# Patient Record
Sex: Male | Born: 1970 | Race: White | Hispanic: No | Marital: Single | Smoking: Current every day smoker
Health system: Southern US, Community
[De-identification: ages and names within clinical notes are randomized; demographics above are authoritative.]

---

## 2007-09-14 ENCOUNTER — Emergency Department (HOSPITAL_COMMUNITY): Admission: EM | Admit: 2007-09-14 | Discharge: 2007-09-14 | Payer: Self-pay | Admitting: Emergency Medicine

## 2010-10-25 ENCOUNTER — Emergency Department (HOSPITAL_COMMUNITY)
Admission: EM | Admit: 2010-10-25 | Discharge: 2010-10-25 | Payer: Self-pay | Source: Home / Self Care | Admitting: Emergency Medicine

## 2011-01-15 LAB — URINE MICROSCOPIC-ADD ON

## 2011-01-15 LAB — URINALYSIS, ROUTINE W REFLEX MICROSCOPIC
Bilirubin Urine: NEGATIVE
Glucose, UA: NEGATIVE mg/dL
Ketones, ur: NEGATIVE mg/dL
Leukocytes, UA: NEGATIVE
Nitrite: NEGATIVE
Protein, ur: 30 mg/dL — AB
Specific Gravity, Urine: 1.03 (ref 1.005–1.030)
Urobilinogen, UA: 0.2 mg/dL (ref 0.0–1.0)
pH: 5.5 (ref 5.0–8.0)

## 2011-01-15 LAB — BASIC METABOLIC PANEL
BUN: 22 mg/dL (ref 6–23)
CO2: 24 mEq/L (ref 19–32)
Calcium: 9.1 mg/dL (ref 8.4–10.5)
Creatinine, Ser: 1.37 mg/dL (ref 0.4–1.5)
GFR calc non Af Amer: 58 mL/min — ABNORMAL LOW (ref >60)
Glucose, Bld: 112 mg/dL — ABNORMAL HIGH (ref 70–99)
Sodium: 136 mEq/L (ref 135–145)

## 2011-01-15 LAB — CK: Total CK: 8426 U/L — ABNORMAL HIGH (ref 7–232)

## 2011-01-15 LAB — DIFFERENTIAL
Basophils Absolute: 0 K/uL (ref 0.0–0.1)
Basophils Relative: 0 % (ref 0–1)
Eosinophils Absolute: 0 10*3/uL (ref 0.0–0.7)
Eosinophils Relative: 0 % (ref 0–5)
Lymphocytes Relative: 20 % (ref 12–46)
Lymphs Abs: 2.4 K/uL (ref 0.7–4.0)
Monocytes Absolute: 1.5 K/uL — ABNORMAL HIGH (ref 0.1–1.0)
Monocytes Relative: 12 % (ref 3–12)
Neutro Abs: 8.1 K/uL — ABNORMAL HIGH (ref 1.7–7.7)
Neutrophils Relative %: 67 % (ref 43–77)

## 2011-01-15 LAB — CBC
HCT: 45.4 % (ref 39.0–52.0)
Hemoglobin: 15.7 g/dL (ref 13.0–17.0)
MCH: 32.4 pg (ref 26.0–34.0)
MCHC: 34.6 g/dL (ref 30.0–36.0)
MCV: 93.6 fL (ref 78.0–100.0)
Platelets: 196 10*3/uL (ref 150–400)
RBC: 4.85 MIL/uL (ref 4.22–5.81)
RDW: 12.9 % (ref 11.5–15.5)
WBC: 12 K/uL — ABNORMAL HIGH (ref 4.0–10.5)

## 2011-01-15 LAB — BASIC METABOLIC PANEL WITH GFR
Chloride: 102 meq/L (ref 96–112)
GFR calc Af Amer: >60 mL/min (ref >60)
Potassium: 3.6 meq/L (ref 3.5–5.1)

## 2015-10-03 ENCOUNTER — Emergency Department: Payer: Self-pay

## 2015-10-03 ENCOUNTER — Encounter: Payer: Self-pay | Admitting: Emergency Medicine

## 2015-10-03 ENCOUNTER — Emergency Department
Admission: EM | Admit: 2015-10-03 | Discharge: 2015-10-03 | Disposition: A | Discharge status: Home / Self Care | Payer: Self-pay | Attending: Emergency Medicine | Admitting: Emergency Medicine

## 2015-10-03 DIAGNOSIS — R109 Unspecified abdominal pain: Secondary | ICD-10-CM

## 2015-10-03 DIAGNOSIS — N201 Calculus of ureter: Secondary | ICD-10-CM | POA: Insufficient documentation

## 2015-10-03 DIAGNOSIS — F17200 Nicotine dependence, unspecified, uncomplicated: Secondary | ICD-10-CM | POA: Insufficient documentation

## 2015-10-03 DIAGNOSIS — R102 Pelvic and perineal pain: Secondary | ICD-10-CM

## 2015-10-03 DIAGNOSIS — N1330 Unspecified hydronephrosis: Secondary | ICD-10-CM | POA: Insufficient documentation

## 2015-10-03 DIAGNOSIS — R339 Retention of urine, unspecified: Secondary | ICD-10-CM | POA: Insufficient documentation

## 2015-10-03 DIAGNOSIS — N132 Hydronephrosis with renal and ureteral calculous obstruction: Secondary | ICD-10-CM

## 2015-10-03 LAB — CBC WITH DIFFERENTIAL/PLATELET
BASOS PCT: 1 %
Basophils Absolute: 0.1 10*3/uL (ref 0–0.1)
EOS ABS: 0.2 10*3/uL (ref 0–0.7)
Eosinophils Relative: 1 %
HCT: 46.7 % (ref 40.0–52.0)
HEMOGLOBIN: 15.6 g/dL (ref 13.0–18.0)
LYMPHS ABS: 3.8 10*3/uL — AB (ref 1.0–3.6)
Lymphocytes Relative: 31 %
MCH: 31.8 pg (ref 26.0–34.0)
MCHC: 33.3 g/dL (ref 32.0–36.0)
MCV: 95.3 fL (ref 80.0–100.0)
Monocytes Absolute: 1.5 10*3/uL — ABNORMAL HIGH (ref 0.2–1.0)
Monocytes Relative: 12 %
Neutro Abs: 6.7 10*3/uL — ABNORMAL HIGH (ref 1.4–6.5)
Neutrophils Relative %: 55 %
Platelets: 255 10*3/uL (ref 150–440)
RBC: 4.9 MIL/uL (ref 4.40–5.90)
RDW: 13 % (ref 11.5–14.5)
WBC: 12.3 10*3/uL — ABNORMAL HIGH (ref 3.8–10.6)

## 2015-10-03 LAB — URINALYSIS COMPLETE WITH MICROSCOPIC (ARMC ONLY)
Bacteria, UA: NONE SEEN
Bilirubin Urine: NEGATIVE
GLUCOSE, UA: NEGATIVE mg/dL
NITRITE: NEGATIVE
PROTEIN: 100 mg/dL — AB
Specific Gravity, Urine: 1.029 (ref 1.005–1.030)
pH: 5 (ref 5.0–8.0)

## 2015-10-03 LAB — COMPREHENSIVE METABOLIC PANEL
ALK PHOS: 26 U/L — AB (ref 38–126)
ALT: 50 U/L (ref 17–63)
AST: 37 U/L (ref 15–41)
Albumin: 4.5 g/dL (ref 3.5–5.0)
Anion gap: 11 (ref 5–15)
BUN: 28 mg/dL — AB (ref 6–20)
CALCIUM: 9.6 mg/dL (ref 8.9–10.3)
CHLORIDE: 104 mmol/L (ref 101–111)
CO2: 24 mmol/L (ref 22–32)
CREATININE: 1.32 mg/dL — AB (ref 0.61–1.24)
GFR calc Af Amer: >60 mL/min (ref >60)
Glucose, Bld: 120 mg/dL — ABNORMAL HIGH (ref 65–99)
Potassium: 3.5 mmol/L (ref 3.5–5.1)
SODIUM: 139 mmol/L (ref 135–145)
Total Bilirubin: 0.8 mg/dL (ref 0.3–1.2)
Total Protein: 8.3 g/dL — ABNORMAL HIGH (ref 6.5–8.1)

## 2015-10-03 LAB — LIPASE, BLOOD: LIPASE: 34 U/L (ref 11–51)

## 2015-10-03 MED ORDER — HYDROMORPHONE HCL 1 MG/ML IJ SOLN
1.0000 mg | INTRAMUSCULAR | Status: AC
  Administered 2015-10-03: 1 mg via INTRAMUSCULAR
  Filled 2015-10-03: qty 1

## 2015-10-03 MED ORDER — TAMSULOSIN HCL 0.4 MG PO CAPS: ORAL_CAPSULE | ORAL | Status: AC

## 2015-10-03 MED ORDER — DOCUSATE SODIUM 100 MG PO CAPS: ORAL_CAPSULE | ORAL | Status: AC

## 2015-10-03 MED ORDER — OXYCODONE-ACETAMINOPHEN 5-325 MG PO TABS: 1.0000 | ORAL_TABLET | ORAL | Status: AC | PRN

## 2015-10-03 MED ORDER — OXYCODONE-ACETAMINOPHEN 5-325 MG PO TABS
2.0000 | ORAL_TABLET | Freq: Once | ORAL | Status: AC
  Administered 2015-10-03: 2 via ORAL
  Filled 2015-10-03: qty 2

## 2015-10-03 MED ORDER — ONDANSETRON 4 MG PO TBDP
4.0000 mg | ORAL_TABLET | Freq: Once | ORAL | Status: AC
  Administered 2015-10-03: 4 mg via ORAL
  Filled 2015-10-03: qty 1

## 2015-10-03 MED ORDER — KETOROLAC TROMETHAMINE 30 MG/ML IJ SOLN
15.0000 mg | Freq: Once | INTRAMUSCULAR | Status: AC
  Administered 2015-10-03: 15 mg via INTRAVENOUS
  Filled 2015-10-03: qty 1

## 2015-10-03 MED ORDER — MORPHINE SULFATE (PF) 4 MG/ML IV SOLN
4.0000 mg | Freq: Once | INTRAVENOUS | Status: AC
  Administered 2015-10-03: 4 mg via INTRAVENOUS
  Filled 2015-10-03: qty 1

## 2015-10-03 MED ORDER — ONDANSETRON HCL 4 MG/2ML IJ SOLN
4.0000 mg | Freq: Once | INTRAMUSCULAR | Status: AC
  Administered 2015-10-03: 4 mg via INTRAVENOUS
  Filled 2015-10-03: qty 2

## 2015-10-03 MED ORDER — SODIUM CHLORIDE 0.9 % IV BOLUS (SEPSIS)
1000.0000 mL | INTRAVENOUS | Status: AC
  Administered 2015-10-03: 1000 mL via INTRAVENOUS

## 2015-10-03 MED ORDER — ONDANSETRON HCL 4 MG PO TABS: ORAL_TABLET | ORAL | Status: AC

## 2015-10-03 NOTE — ED Provider Notes (Signed)
[facility] Emergency Department Provider Note  ____________________________________________  Time seen: Approximately 2:58 PM  I have reviewed the triage vital signs and the nursing notes.   HISTORY  Chief Complaint Urinary Retention    HPI Larry Valencia is a 44 y.o. male with no significant past medical history other than tobacco use who presents with several days of decreased ability to urinate, feeling that he is unable to fully empty his bladder, and today feeling like a complete inability to urinate.  He is in obvious distress and discomfort at the time of triage, pacing back and forth and unable to sit down.  He complains of pain and pressure that is severe and constant and aching and occasionally stabbing in his pelvis, lower back, and genitals.  He denies fever/chills, chest pain, shortness of breath, and bowel changes.Nothing makes it better and nothing makes it worse.  He is in severe distress at the time of triage and upon arrival in the exam room, pacing around uncomfortably and diaphoretic and pale.   History reviewed. No pertinent past medical history.  There are no active problems to display for this patient.   History reviewed. No pertinent past surgical history.  Current Outpatient Rx  Name  Route  Sig  Dispense  Refill  . docusate sodium (COLACE) 100 MG capsule      Take 1 tablet once or twice daily as needed for constipation while taking narcotic pain medicine   30 capsule   0   . ondansetron (ZOFRAN) 4 MG tablet      Take 1-2 tabs by mouth every 8 hours as needed for nausea/vomiting   30 tablet   0   . oxyCODONE-acetaminophen (ROXICET) 5-325 MG tablet   Oral   Take 1-2 tablets by mouth every 4 (four) hours as needed for severe pain.   20 tablet   0   . tamsulosin (FLOMAX) 0.4 MG CAPS capsule      Take 1 tablet by mouth daily until you pass the kidney stone or no longer have symptoms   14 capsule   0      Allergies Review of patient's allergies indicates no known allergies.  No family history on file.  Social History Social History  Substance Use Topics  . Smoking status: Current Every Day Smoker  . Smokeless tobacco: None  . Alcohol Use: Yes    Review of Systems Constitutional: No fever/chills Eyes: No visual changes. ENT: No sore throat. Cardiovascular: Denies chest pain. Respiratory: Denies shortness of breath. Gastrointestinal: Severe lower abdominal pain in the suprapubic region. nausea, no vomiting.  No diarrhea.  No constipation. Genitourinary: Negative for dysuria. Musculoskeletal: Low back pain radiating around either to or from his abdomen, he is not sure which.  Slightly worse on the left. Skin: Negative for rash. Neurological: Negative for headaches, focal weakness or numbness.  10-point ROS otherwise negative.  ____________________________________________   PHYSICAL EXAM:  VITAL SIGNS: ED Triage Vitals  Enc Vitals Group     BP 10/03/15 1432 174/130 mmHg     Pulse Rate 10/03/15 1432 60     Resp 10/03/15 1432 20     Temp 10/03/15 1432 98 F (36.7 C)     Temp Source 10/03/15 1432 Oral     SpO2 10/03/15 1432 98 %     Weight 10/03/15 1432 190 lb (86.183 kg)     Height 10/03/15 1432  (1.727 m)     Head Cir --  Peak Flow --      Pain Score 10/03/15 1431 7     Pain Loc --      Pain Edu? --      Excl. in GC? --     Constitutional: Alert and oriented. Well appearing and in no acute distress. Eyes: Conjunctivae are normal. PERRL. EOMI. Head: Atraumatic. Nose: No congestion/rhinnorhea. Mouth/Throat: Mucous membranes are moist.  Oropharynx non-erythematous. Neck: No stridor.   Cardiovascular: Normal rate, regular rhythm. Grossly normal heart sounds.  Good peripheral circulation. Respiratory: Normal respiratory effort.  No retractions. Lungs CTAB. Gastrointestinal: Soft and nontender. No distention. No abdominal bruits. No CVA  tenderness. Genitourinary: deferred Musculoskeletal: No lower extremity tenderness nor edema.  No joint effusions. Neurologic:  Normal speech and language. No gross focal neurologic deficits are appreciated.  Skin:  Skin is warm, dry and intact. No rash noted. Psychiatric: Mood and affect are anxious due to pain.   ____________________________________________   LABS (all labs ordered are listed, but only abnormal results are displayed)  Labs Reviewed  URINALYSIS COMPLETEWITH MICROSCOPIC ([facility] ONLY) - Abnormal; Notable for the following:    Color, Urine AMBER (*)    APPearance TURBID (*)    Ketones, ur TRACE (*)    Hgb urine dipstick 2+ (*)    Protein, ur 100 (*)    Leukocytes, UA 1+ (*)    Squamous Epithelial / LPF 0-5 (*)    All other components within normal limits  COMPREHENSIVE METABOLIC PANEL - Abnormal; Notable for the following:    Glucose, Bld 120 (*)    BUN 28 (*)    Creatinine, Ser 1.32 (*)    Total Protein 8.3 (*)    Alkaline Phosphatase 26 (*)    All other components within normal limits  CBC WITH DIFFERENTIAL/PLATELET - Abnormal; Notable for the following:    WBC 12.3 (*)    Neutro Abs 6.7 (*)    Lymphs Abs 3.8 (*)    Monocytes Absolute 1.5 (*)    All other components within normal limits  URINE CULTURE  LIPASE, BLOOD   ____________________________________________  EKG  Not indicated ____________________________________________  RADIOLOGY   Ct Renal Stone Study  10/03/2015  CLINICAL DATA:  States he is unable to empty his bladder Voiding small amounts at a time unable to sit still EXAM: CT ABDOMEN AND PELVIS WITHOUT CONTRAST TECHNIQUE: Multidetector CT imaging of the abdomen and pelvis was performed following the standard protocol without IV contrast. COMPARISON:  None. FINDINGS: Lower chest: No pulmonary nodules, pleural effusions, or infiltrates. Heart size is normal. No imaged pericardial effusion or significant coronary artery calcifications. Upper  abdomen: There is moderate right hydronephrosis and hydroureter. Perinephric and periureteral stranding are present. There is a 3 mm calculus at the right ureterovesical junction. The left kidney and ureter are normal in appearance. No focal abnormality identified within the liver, spleen, pancreas, or adrenal glands. The gallbladder is present. Gastrointestinal tract: Stomach and small bowel loops are normal in appearance. The appendix is well seen and has a normal appearance. Colonic loops are normal in appearance. Pelvis: Urinary bladder, prostate gland, and seminal vesicles are normal in appearance. No free pelvic fluid. Retroperitoneum: There is atherosclerotic calcification of the abdominal aorta. No retroperitoneal or mesenteric adenopathy. Abdominal wall: Unremarkable. Osseous structures: Unremarkable. IMPRESSION: 1. 3 mm calculus at the right ureterovesical junction associated with right-sided hydronephrosis and hydroureter. 2. Abdominal aortic atherosclerosis. 3. Normal appendix. Electronically Signed   By: Norva Pavlov M.D.   On: 10/03/2015 16:22  ____________________________________________   PROCEDURES  Procedure(s) performed: None  Critical Care performed: No ____________________________________________   INITIAL IMPRESSION / ASSESSMENT AND PLAN / ED COURSE  Pertinent labs & imaging results that were available during my care of the patient were reviewed by me and considered in my medical decision making (see chart for details).  The patient is in severe distress and is unable to hold still for either an IV placement or for an I&O catheter.  His bladder scan shows only about 30-40 mL, but he is presenting most consistent with either urinary retention or a kidney stone.  I gave him Dilaudid 1 mg intramuscular which helped him calm down enough to have an IV placed.  He vomited shortly after the Dilaudid.  He still appears uncomfortable but is less restless at this time.  The nurse  performed a rosacea and got a very minimal amount of urine back which we will sent a urinalysis.  The patient is somewhat concerned about the possibility of a sexual transmitted disease although he denies any discharge from the tip of his penis when he is not urinating, but at this time we do not have enough urine to send [facility].  I will proceed with the top concern being that of either an obstructing kidney stone, pyelonephritis, or both.  Lab results are pending.  I am providing 1 L of IV fluids and additional analgesia as needed.  ----------------------------------------- 5:18 PM on 10/03/2015 -----------------------------------------  Patient has a 3 mm obstructing stone on the right with moderate hydronephrosis.  His pain is well controlled at this time.  I updated him about the findings.  There is no indication of infection in the urine but I am sending it to culture to make sure that there is not a risk of developing an infected/impacted stone.  I usual and customary advice, management recommendations, and return precautions.  He understands and agrees with the plan.  ____________________________________________  FINAL CLINICAL IMPRESSION(S) / ED DIAGNOSES  Final diagnoses:  Urinary retention  Bilateral flank pain  Suprapubic pain, acute  Hydronephrosis with obstructing calculus      NEW MEDICATIONS STARTED DURING THIS VISIT:  New Prescriptions   DOCUSATE SODIUM (COLACE) 100 MG CAPSULE    Take 1 tablet once or twice daily as needed for constipation while taking narcotic pain medicine   ONDANSETRON (ZOFRAN) 4 MG TABLET    Take 1-2 tabs by mouth every 8 hours as needed for nausea/vomiting   OXYCODONE-ACETAMINOPHEN (ROXICET) 5-325 MG TABLET    Take 1-2 tablets by mouth every 4 (four) hours as needed for severe pain.   TAMSULOSIN (FLOMAX) 0.4 MG CAPS CAPSULE    Take 1 tablet by mouth daily until you pass the kidney stone or no longer have symptoms     Loleta Roseory  Antanette Richwine, MD 10/03/15 1718

## 2015-10-03 NOTE — ED Notes (Signed)
Patient is pacing the room back and forth c/o pain in pelvic area, lower back, and penis. Patient states he has been in pain and feeling like he has to pee every 10 minutes for a couple of days.

## 2015-10-03 NOTE — Discharge Instructions (Signed)
You have been seen in the Emergency Department (ED) today for pain that is caused by a kidney stone (actually a stone in your right ureter).  As we have discussed, please drink plenty of fluids.  Please make a follow up appointment with the physician(s) listed elsewhere in this documentation.  You may take pain medication as needed but ONLY as prescribed.  Please also take your prescribed Flomax daily.  We also recommend that you take over-the-counter ibuprofen regularly according to label instructions over the next 5 days.  Take it with meals to minimize stomach discomfort.  Please see your doctor as soon as possible as stones may take 1-3 weeks to pass and you may require additional care or medications.  Do not drink alcohol, drive or participate in any other potentially dangerous activities while taking opiate pain medication as it may make you sleepy. Do not take this medication with any other sedating medications, either prescription or over-the-counter. If you were prescribed Percocet or Vicodin, do not take these with acetaminophen (Tylenol) as it is already contained within these medications.   Take Percocet as needed for severe pain.  This medication is an opiate (or narcotic) pain medication and can be habit forming.  Use it as little as possible to achieve adequate pain control.  Do not use or use it with extreme caution if you have a history of opiate abuse or dependence.  If you are on a pain contract with your primary care doctor or a pain specialist, be sure to let them know you were prescribed this medication today from the [facility] Emergency Department.  This medication is intended for your use only - do not give any to anyone else and keep it in a secure place where nobody else, especially children, have access to it.  It will also cause or worsen constipation, so you may want to consider taking an over-the-counter stool softener while you are taking this medication.  Return to  the Emergency Department (ED) or call your doctor if you have any worsening pain, fever, painful urination, are unable to urinate, or develop other symptoms that concern you.    Kidney Stones Kidney stones (urolithiasis) are deposits that form inside your kidneys. The intense pain is caused by the stone moving through the urinary tract. When the stone moves, the ureter goes into spasm around the stone. The stone is usually passed in the urine.  CAUSES   A disorder that makes certain neck glands produce too much parathyroid hormone (primary hyperparathyroidism).  A buildup of uric acid crystals, similar to gout in your joints.  Narrowing (stricture) of the ureter.  A kidney obstruction present at birth (congenital obstruction).  Previous surgery on the kidney or ureters.  Numerous kidney infections. SYMPTOMS   Feeling sick to your stomach (nauseous).  Throwing up (vomiting).  Blood in the urine (hematuria).  Pain that usually spreads (radiates) to the groin.  Frequency or urgency of urination. DIAGNOSIS   Taking a history and physical exam.  Blood or urine tests.  CT scan.  Occasionally, an examination of the inside of the urinary bladder (cystoscopy) is performed. TREATMENT   Observation.  Increasing your fluid intake.  Extracorporeal shock wave lithotripsy--This is a noninvasive procedure that uses shock waves to break up kidney stones.  Surgery may be needed if you have severe pain or persistent obstruction. There are various surgical procedures. Most of the procedures are performed with the use of small instruments. Only small incisions are needed to  accommodate these instruments, so recovery time is minimized. The size, location, and chemical composition are all important variables that will determine the proper choice of action for you. Talk to your health care provider to better understand your situation so that you will minimize the risk of injury to yourself  and your kidney.  HOME CARE INSTRUCTIONS   Drink enough water and fluids to keep your urine clear or pale yellow. This will help you to pass the stone or stone fragments.  Strain all urine through the provided strainer. Keep all particulate matter and stones for your health care provider to see. The stone causing the pain may be as small as a grain of salt. It is very important to use the strainer each and every time you pass your urine. The collection of your stone will allow your health care provider to analyze it and verify that a stone has actually passed. The stone analysis will often identify what you can do to reduce the incidence of recurrences.  Only take over-the-counter or prescription medicines for pain, discomfort, or fever as directed by your health care provider.  Keep all follow-up visits as told by your health care provider. This is important.  Get follow-up X-rays if required. The absence of pain does not always mean that the stone has passed. It may have only stopped moving. If the urine remains completely obstructed, it can cause loss of kidney function or even complete destruction of the kidney. It is your responsibility to make sure X-rays and follow-ups are completed. Ultrasounds of the kidney can show blockages and the status of the kidney. Ultrasounds are not associated with any radiation and can be performed easily in a matter of minutes.  Make changes to your daily diet as told by your health care provider. You may be told to:  Limit the amount of salt that you eat.  Eat 5 or more servings of fruits and vegetables each day.  Limit the amount of meat, poultry, fish, and eggs that you eat.  Collect a 24-hour urine sample as told by your health care provider.You may need to collect another urine sample every 6-12 months. SEEK MEDICAL CARE IF:  You experience pain that is progressive and unresponsive to any pain medicine you have been prescribed. SEEK IMMEDIATE  MEDICAL CARE IF:   Pain cannot be controlled with the prescribed medicine.  You have a fever or shaking chills.  The severity or intensity of pain increases over 18 hours and is not relieved by pain medicine.  You develop a new onset of abdominal pain.  You feel faint or pass out.  You are unable to urinate.   This information is not intended to replace advice given to you by your health care provider. Make sure you discuss any questions you have with your health care provider.   Document Released: 10/22/2005 Document Revised: 07/13/2015 Document Reviewed: 03/25/2013 Elsevier Interactive Patient Education 2016 [employer]    Hydronephrosis Hydronephrosis is the enlargement of a kidney due to a blockage that stops urine from flowing out of the body. CAUSES Common causes of this condition include:  A birth (congenital) defect of the kidney.  A congenital defect of the tube through which urine travels (ureter).  Kidney stones.  An enlarged prostate gland.  A tumor.  Cancer of the prostate, bladder, uterus, ovary, or colon.  A blood clot. SYMPTOMS Symptoms of this condition include:  Pain or discomfort in your side (flank).  Swelling of the abdomen.  Pain in the abdomen.  Nausea and vomiting.  Fever.  Pain while passing urine.  Feeling of urgency to urinate.  Frequent urination.  Infection of the urinary tract. In some cases, there are no symptoms. DIAGNOSIS This condition may be diagnosed with:  A medical history.  A physical exam.  Blood and urine tests to check kidney function.  Imaging tests, such as an X-ray, ultrasound, CT scan, or MRI.  A test in which a rigid or flexible telescope (cystoscope) is used to view the site of the blockage. TREATMENT Treatment for this condition depends on where the blockage is located, how long it has been there, and what caused it. The goal of treatment is to remove the blockage. Treatment options  include:  A procedure to put in a soft tube to help drain urine.  Antibiotic medicines to treat or prevent infection.  Shock-wave therapy (lithotripsy) to help eliminate kidney stones. HOME CARE INSTRUCTIONS  Get lots of rest.  Drink enough fluid to keep your urine clear or pale yellow.  If you have a drain in, follow your health care provider's instructions about how to care for it.  Take medicines only as directed by your health care provider.  If you were prescribed an antibiotic medicine, finish all of it even if you start to feel better.  Keep all follow-up visits as directed by your health care provider. This is important. SEEK MEDICAL CARE IF:  You continue to have symptoms after treatment.  You develop new symptoms.  You have a problem with a drainage device.  Your urine becomes cloudy or bloody.  You have a fever. SEEK IMMEDIATE MEDICAL CARE IF:  You have severe flank or abdominal pain.  You develop vomiting and are unable to keep fluids down.   This information is not intended to replace advice given to you by your health care provider. Make sure you discuss any questions you have with your health care provider.   Document Released: 08/19/2007 Document Revised: 03/08/2015 Document Reviewed: 10/18/2014 Elsevier Interactive Patient Education [employer]

## 2015-10-03 NOTE — ED Notes (Signed)
States he is unable to empty his bladder   Voiding small amts at a time unable to sit still

## 2015-10-06 LAB — URINE CULTURE: Special Requests: NORMAL

## 2015-12-05 ENCOUNTER — Emergency Department (HOSPITAL_COMMUNITY): Admission: EM | Admit: 2015-12-05 | Discharge: 2015-12-05 | Discharge status: Against Medical Advice | Payer: Self-pay

## 2015-12-05 ENCOUNTER — Emergency Department (HOSPITAL_COMMUNITY)
Admission: EM | Admit: 2015-12-05 | Discharge: 2015-12-05 | Disposition: A | Discharge status: Home / Self Care | Payer: Self-pay | Attending: Emergency Medicine | Admitting: Emergency Medicine

## 2015-12-05 ENCOUNTER — Encounter (HOSPITAL_COMMUNITY): Payer: Self-pay | Admitting: Emergency Medicine

## 2015-12-05 ENCOUNTER — Emergency Department (HOSPITAL_COMMUNITY): Payer: Self-pay

## 2015-12-05 DIAGNOSIS — R319 Hematuria, unspecified: Secondary | ICD-10-CM | POA: Insufficient documentation

## 2015-12-05 DIAGNOSIS — R109 Unspecified abdominal pain: Secondary | ICD-10-CM | POA: Insufficient documentation

## 2015-12-05 DIAGNOSIS — F17200 Nicotine dependence, unspecified, uncomplicated: Secondary | ICD-10-CM | POA: Insufficient documentation

## 2015-12-05 DIAGNOSIS — R309 Painful micturition, unspecified: Secondary | ICD-10-CM | POA: Insufficient documentation

## 2015-12-05 LAB — CBC
HCT: 46.6 % (ref 39.0–52.0)
Hemoglobin: 16.1 g/dL (ref 13.0–17.0)
MCH: 31.9 pg (ref 26.0–34.0)
MCHC: 34.5 g/dL (ref 30.0–36.0)
MCV: 92.5 fL (ref 78.0–100.0)
Platelets: 273 10*3/uL (ref 150–400)
RBC: 5.04 MIL/uL (ref 4.22–5.81)
RDW: 12.5 % (ref 11.5–15.5)
WBC: 16.7 10*3/uL — ABNORMAL HIGH (ref 4.0–10.5)

## 2015-12-05 LAB — URINALYSIS, ROUTINE W REFLEX MICROSCOPIC
Glucose, UA: NEGATIVE mg/dL
Ketones, ur: NEGATIVE mg/dL
Nitrite: NEGATIVE
Protein, ur: NEGATIVE mg/dL
Specific Gravity, Urine: 1.02 (ref 1.005–1.030)
pH: 6 (ref 5.0–8.0)

## 2015-12-05 LAB — BASIC METABOLIC PANEL
Anion gap: 12 (ref 5–15)
BUN: 21 mg/dL — ABNORMAL HIGH (ref 6–20)
CO2: 19 mmol/L — ABNORMAL LOW (ref 22–32)
Calcium: 9.6 mg/dL (ref 8.9–10.3)
Chloride: 107 mmol/L (ref 101–111)
Creatinine, Ser: 1.23 mg/dL (ref 0.61–1.24)
GFR calc Af Amer: >60 mL/min (ref >60)
GFR calc non Af Amer: >60 mL/min (ref >60)
Glucose, Bld: 131 mg/dL — ABNORMAL HIGH (ref 65–99)
Potassium: 3.8 mmol/L (ref 3.5–5.1)
Sodium: 138 mmol/L (ref 135–145)

## 2015-12-05 LAB — URINE MICROSCOPIC-ADD ON

## 2015-12-05 MED ORDER — HYDROMORPHONE HCL 1 MG/ML IJ SOLN
1.0000 mg | Freq: Once | INTRAMUSCULAR | Status: AC
  Administered 2015-12-05: 1 mg via INTRAMUSCULAR
  Filled 2015-12-05: qty 1

## 2015-12-05 MED ORDER — KETOROLAC TROMETHAMINE 30 MG/ML IJ SOLN
30.0000 mg | Freq: Once | INTRAMUSCULAR | Status: AC
  Administered 2015-12-05: 30 mg via INTRAMUSCULAR
  Filled 2015-12-05: qty 1

## 2015-12-05 MED ORDER — OXYCODONE-ACETAMINOPHEN 5-325 MG PO TABS: 1.0000 | ORAL_TABLET | ORAL | Status: AC | PRN

## 2015-12-05 MED ORDER — ONDANSETRON 4 MG PO TBDP
4.0000 mg | ORAL_TABLET | Freq: Once | ORAL | Status: AC
  Administered 2015-12-05: 4 mg via ORAL
  Filled 2015-12-05: qty 1

## 2015-12-05 NOTE — ED Notes (Signed)
Pt c/o sudden onset R flank pain, burning with urination, blood in urine since this morning. Pt was diagnosed with kidney stone about a month ago and believes he never passed it but pain went away. A&Ox4 and ambulatory. Pt appears to be in severe pain, breathing quickly, unable to sit, shaking.

## 2015-12-05 NOTE — Discharge Instructions (Signed)
Flank Pain °Flank pain is pain in your side. The flank is the area of your side between your upper belly (abdomen) and your back. Pain in this area can be caused by many different things. °HOME CARE °Home care and treatment will depend on the cause of your pain. °· Rest as told by your doctor. °· Drink enough fluids to keep your pee (urine) clear or pale yellow.   °· Only take medicine as told by your doctor. °· Tell your doctor about any changes in your pain. °· Follow up with your doctor. °GET HELP RIGHT AWAY IF:  °· Your pain does not get better with medicine.   °· You have new symptoms or your symptoms get worse. °· Your pain gets worse.   °· You have belly (abdominal) pain.   °· You are short of breath.   °· You always feel sick to your stomach (nauseous).   °· You keep throwing up (vomiting).   °· You have puffiness (swelling) in your belly.   °· You feel light-headed or you pass out (faint).   °· You have blood in your pee. °· You have a fever or lasting symptoms for more than 2-3 days. °· You have a fever and your symptoms suddenly get worse. °MAKE SURE YOU:  °· Understand these instructions. °· Will watch your condition. °· Will get help right away if you are not doing well or get worse. °  °This information is not intended to replace advice given to you by your health care provider. Make sure you discuss any questions you have with your health care provider. °  °Document Released: 07/31/2008 Document Revised: 11/12/2014 Document Reviewed: 06/05/2012 °Elsevier Interactive Patient Education ©2016 [employer] Inc. ° °

## 2015-12-07 LAB — URINE CULTURE
Culture: NO GROWTH
Special Requests: NORMAL

## 2015-12-07 NOTE — ED Provider Notes (Signed)
CSN: [id number]     Arrival date & time 12/05/15  1339 History   First MD Initiated Contact with Patient 12/05/15 1544     Chief Complaint  Patient presents with  . Nephrolithiasis  . Flank Pain     (Consider location/radiation/quality/duration/timing/severity/associated sxs/prior Treatment) HPI   45 year old male  With right flank pain. Acute onset earlier this morning. Denies any trauma.Burning sensation with urination. Triage note mentions hematuria. Patient denies this to me but states that this Urinew as darker in color than normal.Nausea No vomiting.No fevers or chills. Very uncomfortable. Reports fairly recent diagnosis of kidney stone about a month ago.He questions whether it actually fully past. His pain did improve after last evaluation but he continued to have some nagging symptoms in his right flank until much more severe pain today.  History reviewed. No pertinent past medical history. History reviewed. No pertinent past surgical history. No family history on file. Social History  Substance Use Topics  . Smoking status: Current Every Day Smoker  . Smokeless tobacco: None  . Alcohol Use: Yes    Review of Systems  All systems reviewed and negative, other than as noted in HPI.   Allergies  Review of patient's allergies indicates no known allergies.  Home Medications   Prior to Admission medications   Medication Sig Start Date End Date Taking? Authorizing Provider  docusate sodium (COLACE) 100 MG capsule Take 1 tablet once or twice daily as needed for constipation while taking narcotic pain medicine Patient not taking: Reported on 12/05/2015 10/03/15   Loleta Rose, MD  ondansetron [facility]) 4 MG tablet Take 1-2 tabs by mouth every 8 hours as needed for nausea/vomiting Patient not taking: Reported on 12/05/2015 10/03/15   Loleta Rose, MD  oxyCODONE-acetaminophen (PERCOCET/ROXICET) 5-325 MG tablet Take 1-2 tablets by mouth every 4 (four) hours as needed for severe pain.  12/05/15   Raeford Razor, MD  oxyCODONE-acetaminophen (ROXICET) 5-325 MG tablet Take 1-2 tablets by mouth every 4 (four) hours as needed for severe pain. Patient not taking: Reported on 12/05/2015 10/03/15   Loleta Rose, MD  tamsulosin [facility]) 0.4 MG CAPS capsule Take 1 tablet by mouth daily until you pass the kidney stone or no longer have symptoms Patient not taking: Reported on 12/05/2015 10/03/15   Loleta Rose, MD   BP 143/91 mmHg  Pulse 60  Temp(Src) 98.2 F (36.8 C) (Oral)  Resp 16  SpO2 98% Physical Exam  Constitutional: He appears well-developed and well-nourished. No distress.  HENT:  Head: Normocephalic and atraumatic.  Eyes: Conjunctivae are normal. Right eye exhibits no discharge. Left eye exhibits no discharge.  Neck: Neck supple.  Cardiovascular: Normal rate, regular rhythm and normal heart sounds.  Exam reveals no gallop and no friction rub.   No murmur heard. Pulmonary/Chest: Effort normal and breath sounds normal. No respiratory distress.  Abdominal: Soft. He exhibits no distension. There is no tenderness.  Musculoskeletal: He exhibits no edema or tenderness.  Neurological: He is alert.  Skin: Skin is warm and dry.  Psychiatric: He has a normal mood and affect. His behavior is normal. Thought content normal.  Nursing note and vitals reviewed.   ED Course  Procedures (including critical care time) Labs Review Labs Reviewed  URINALYSIS, ROUTINE W REFLEX MICROSCOPIC (NOT AT [facility]) - Abnormal; Notable for the following:    Color, Urine AMBER (*)    APPearance CLOUDY (*)    Hgb urine dipstick MODERATE (*)    Bilirubin Urine SMALL (*)    Leukocytes, UA  TRACE (*)    All other components within normal limits  BASIC METABOLIC PANEL - Abnormal; Notable for the following:    CO2 19 (*)    Glucose, Bld 131 (*)    BUN 21 (*)    All other components within normal limits  CBC - Abnormal; Notable for the following:    WBC 16.7 (*)    All other components within normal  limits  URINE MICROSCOPIC-ADD ON - Abnormal; Notable for the following:    Squamous Epithelial / LPF 0-5 (*)    Bacteria, UA RARE (*)    All other components within normal limits  URINE CULTURE    Imaging Review US Renal  12/05/2015  CLINICAL DATA:  Right flank pain for 1 day. History of nephrolithiasis. EXAM: RENAL / URINARY TRACT ULTRASOUND COMPLETE COMPARISON:  Abdominal pelvic CT 10/03/2015. FINDINGS: Right Kidney: Length: 11.1 cm. Echogenicity within normal limits. No mass or hydronephrosis visualized. Mild prominence of the renal sinus fat. Left Kidney: Length: 11.2 cm. Echogenicity within normal limits. No mass or hydronephrosis visualized. Mild prominence of the renal sinus fat. Bladder: Appears normal for degree of bladder distention. IMPRESSION: Negative renal ultrasound.  No evidence of hydronephrosis. Electronically Signed   By: Carey Bullocks M.D.   On: 12/05/2015 18:45   I have personally reviewed and evaluated these images and lab results as part of my medical decision-making.   EKG Interpretation None      MDM   Final diagnoses:  Right flank pain     45 year old male with symptoms consistent with ureteral colic. Renal ultrasound without hydronephrosis. Renal function is fine. There is some blood noted on his [place] is consistent with a kidney stone.He is afebrile. Nontoxic. Plan symptomatic treatment expectant management.   Raeford Razor, MD 12/07/15 1329

## 2016-01-18 IMAGING — CT CT RENAL STONE PROTOCOL
1 of 2 series · 15 of 32 positions shown, 19 images · non-contrast
Comparison: None.

CLINICAL DATA: States he is unable to empty his bladder Voiding
small amounts at a time unable to sit still

EXAM:
CT ABDOMEN AND PELVIS WITHOUT CONTRAST
TECHNIQUE: Multidetector CT imaging of the abdomen and pelvis was performed
following the standard protocol without IV contrast.

[Series 2: stone standard full · axial · 0.80mm/px · z∈[-1032,-638]mm · 15 of 87 slices shown, 19 images]
[im 4/87  soft-tissue]
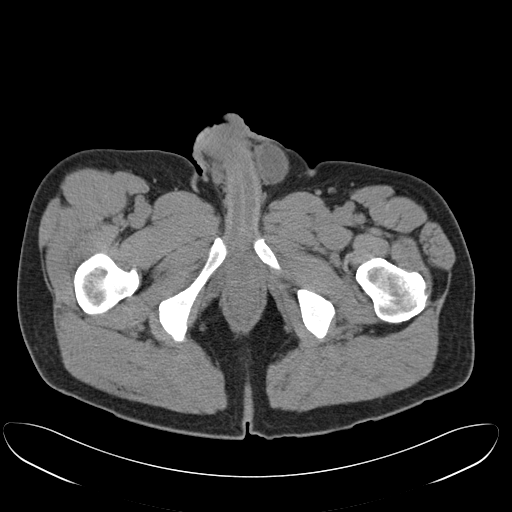
[im 4/87  bone]
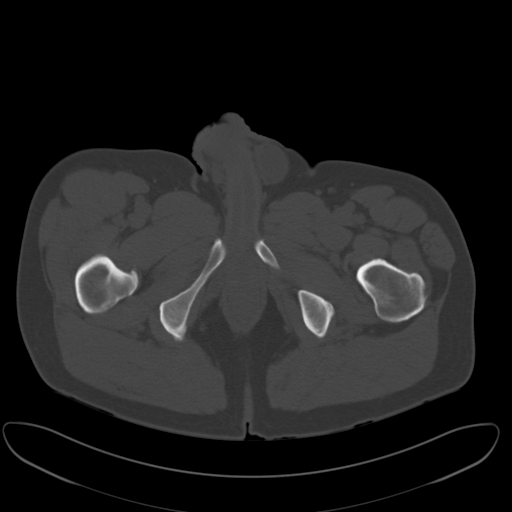
[im 12/87  soft-tissue]
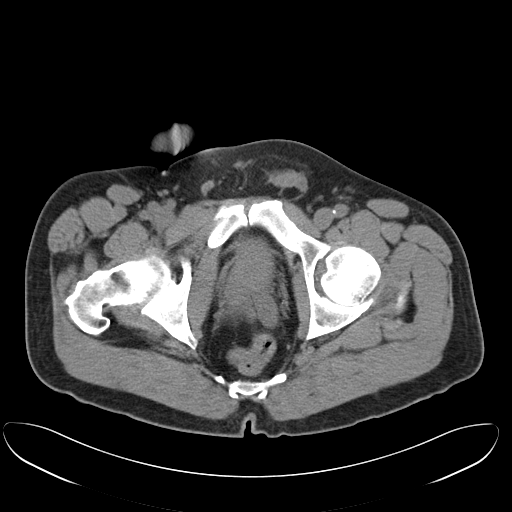
[im 19/87  soft-tissue]
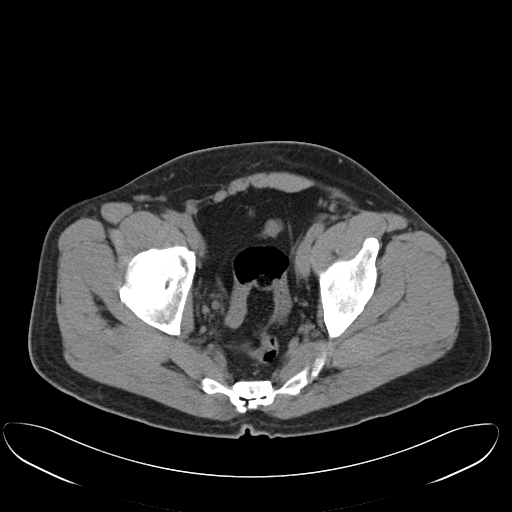
[im 23/87  soft-tissue]
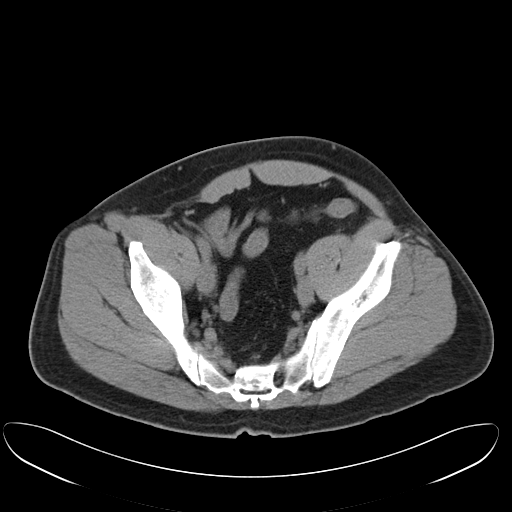
[im 30/87  soft-tissue]
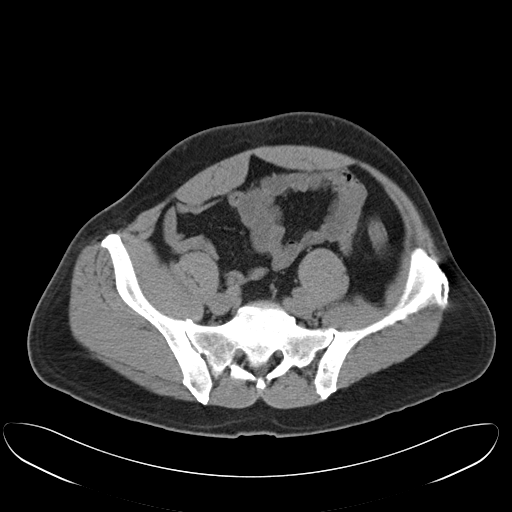
[im 38/87  soft-tissue]
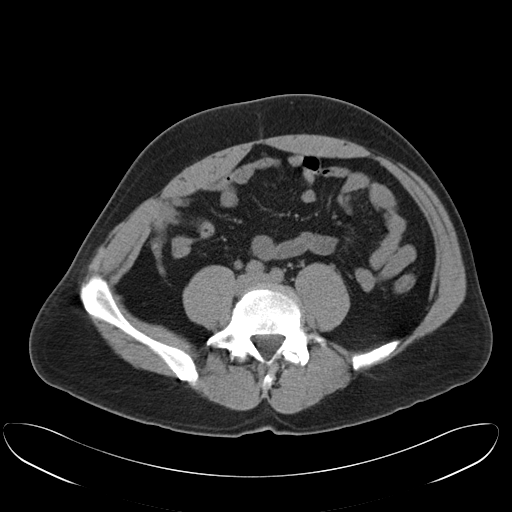
[im 45/87  soft-tissue]
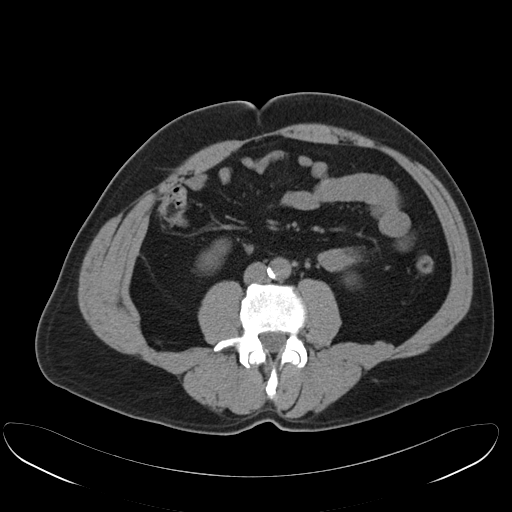
[im 49/87  soft-tissue]
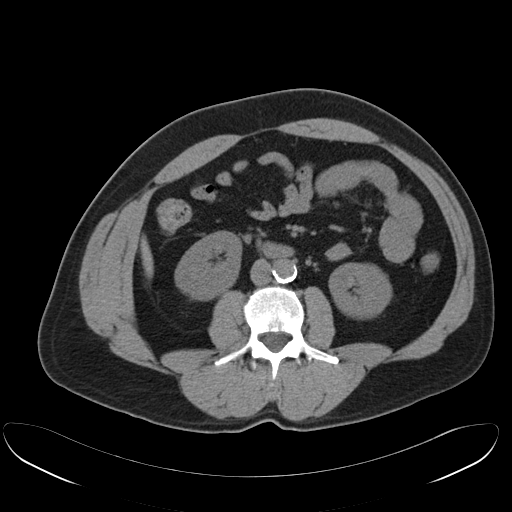
[im 57/87  soft-tissue]
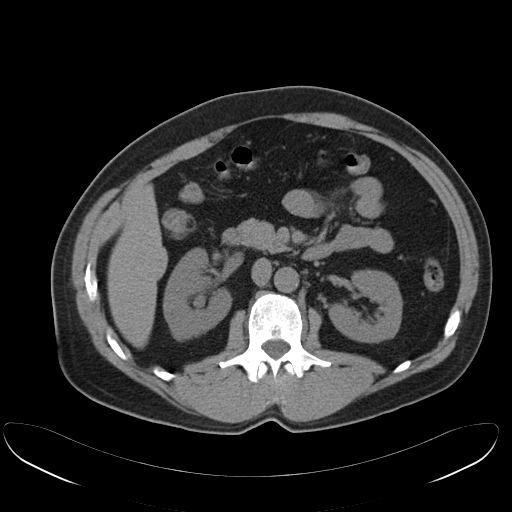
[im 57/87  bone]
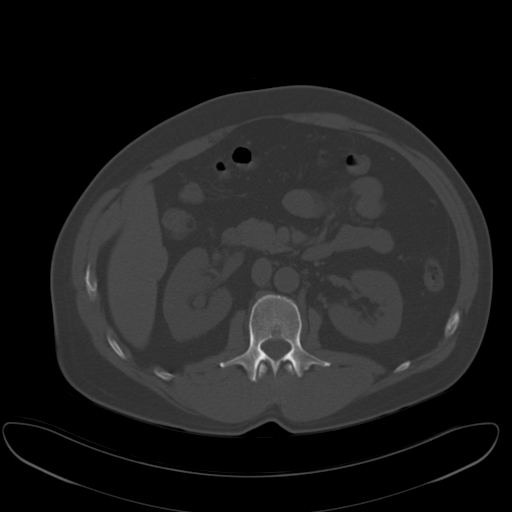
[im 64/87  soft-tissue]
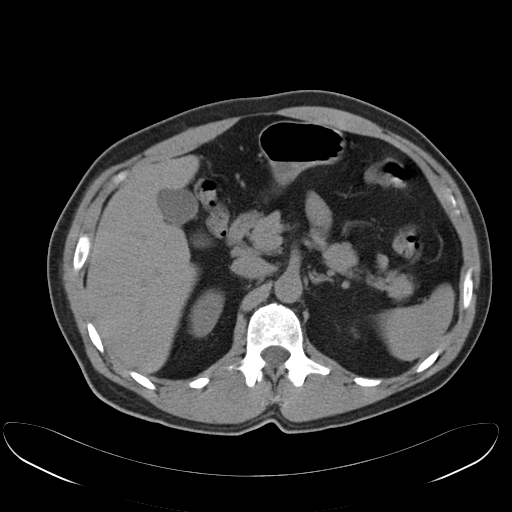
[im 68/87  soft-tissue]
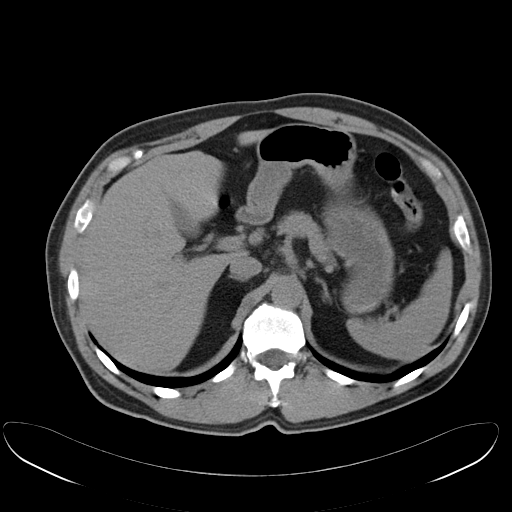
[im 72/87  lung]
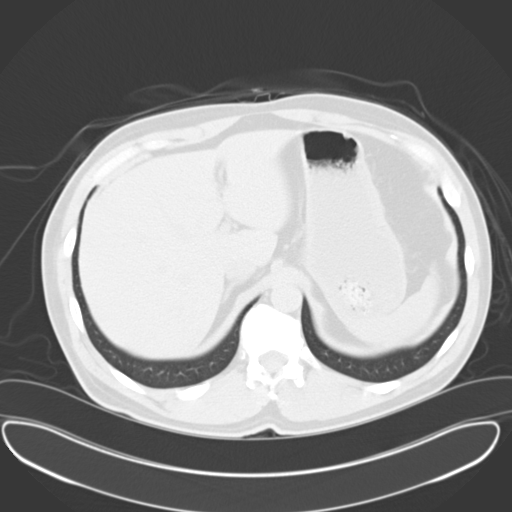
[im 75/87  soft-tissue]
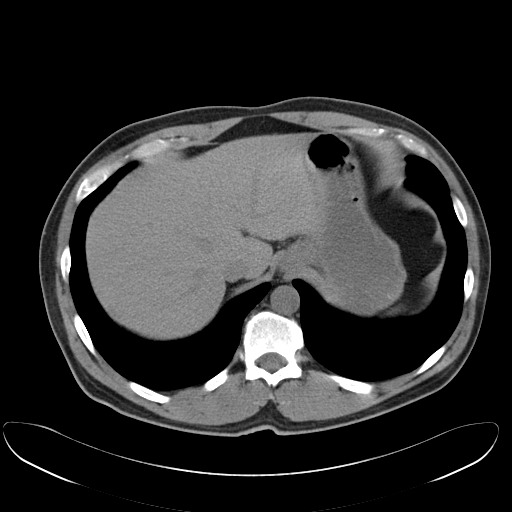
[im 75/87  lung]
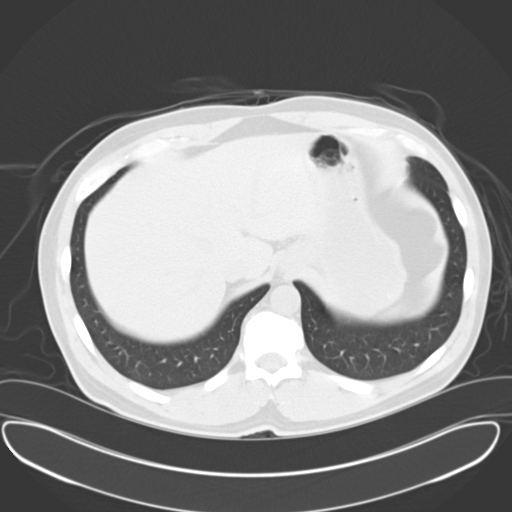
[im 79/87  lung]
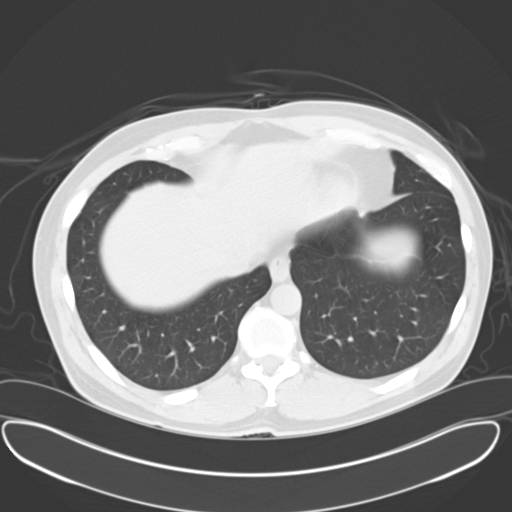
[im 83/87  soft-tissue]
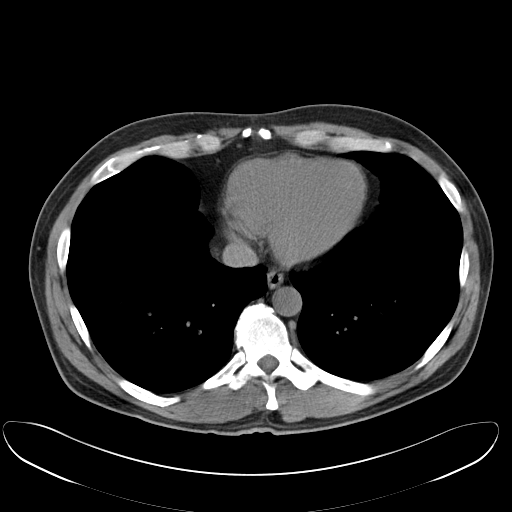
[im 83/87  lung]
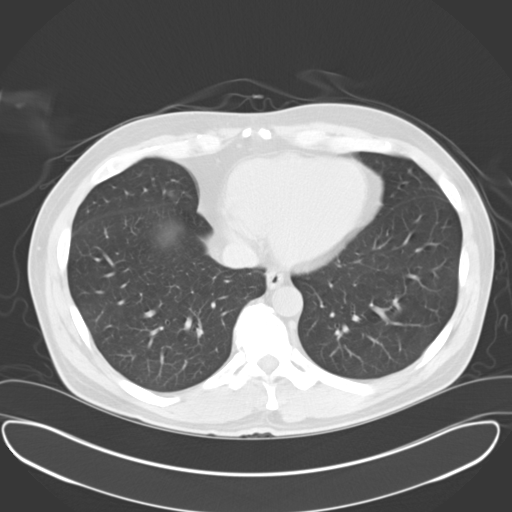

[15 of 32 positions shown; findings below may reference images not displayed]

FINDINGS: Lower chest: No pulmonary nodules, pleural effusions, or
infiltrates. Heart size is normal. No imaged pericardial effusion or
significant coronary artery calcifications.

Upper abdomen: There is moderate right hydronephrosis and
hydroureter. Perinephric and periureteral stranding are present.
There is a 3 mm calculus at the right ureterovesical junction.

The left kidney and ureter are normal in appearance.

No focal abnormality identified within the liver, spleen, pancreas,
or adrenal glands. The gallbladder is present.

Gastrointestinal tract: Stomach and small bowel loops are normal in
appearance. The appendix is well seen and has a normal appearance.
Colonic loops are normal in appearance.

Pelvis: Urinary bladder, prostate gland, and seminal vesicles are
normal in appearance. No free pelvic fluid.

Retroperitoneum: There is atherosclerotic calcification of the
abdominal aorta. No retroperitoneal or mesenteric adenopathy.

Abdominal wall: Unremarkable.

Osseous structures: Unremarkable.
IMPRESSION: 1. 3 mm calculus at the right ureterovesical junction associated
with right-sided hydronephrosis and hydroureter.
2. Abdominal aortic atherosclerosis.
3. Normal appendix.

## 2016-07-06 DEATH — deceased

## 2017-10-22 IMAGING — US US RENAL
1 series · 14 of 25 positions shown · non-contrast
Comparison: Abdominal pelvic CT 10/03/2015.

CLINICAL DATA: Right flank pain for 1 day. History of
nephrolithiasis.

EXAM:
RENAL / URINARY TRACT ULTRASOUND COMPLETE

[Series 1: us renal · 0.24mm/px · 14 of 54 slices shown]
[im 1/54]
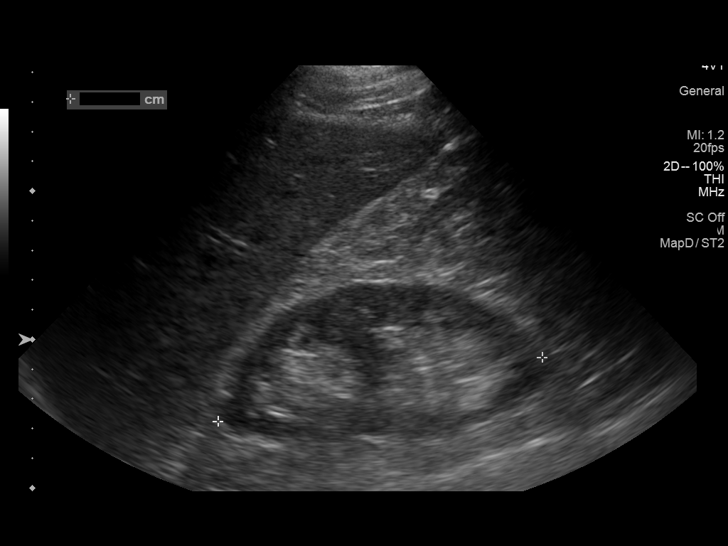
[im 5/54]
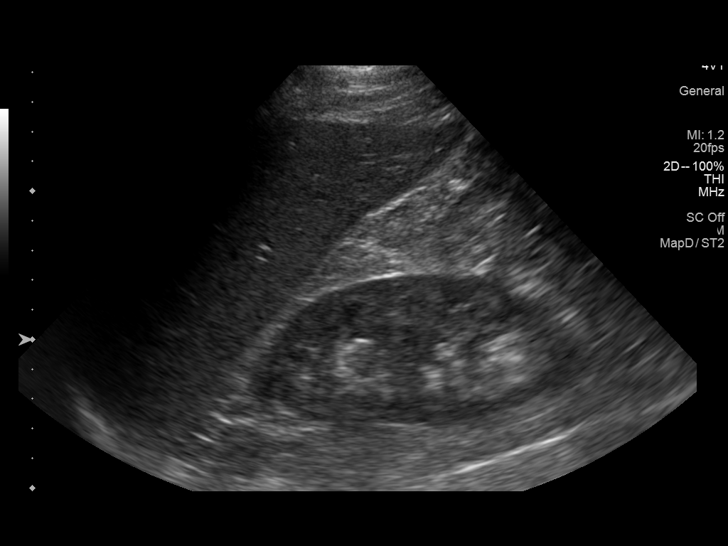
[im 9/54]
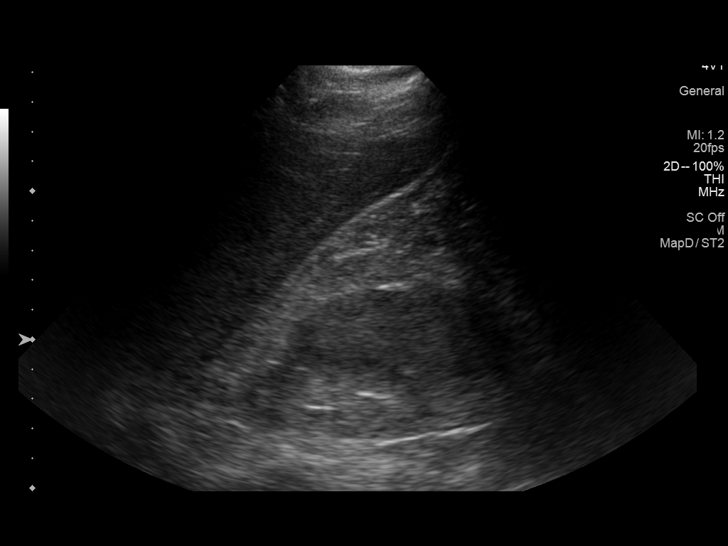
[im 14/54]
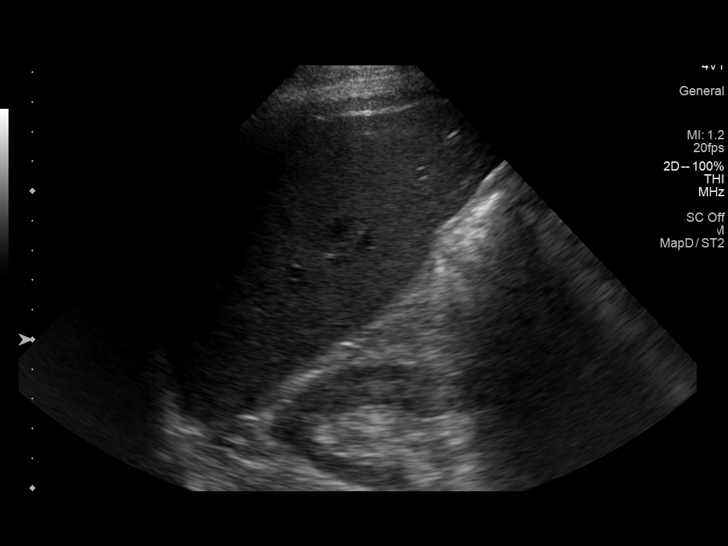
[im 18/54]
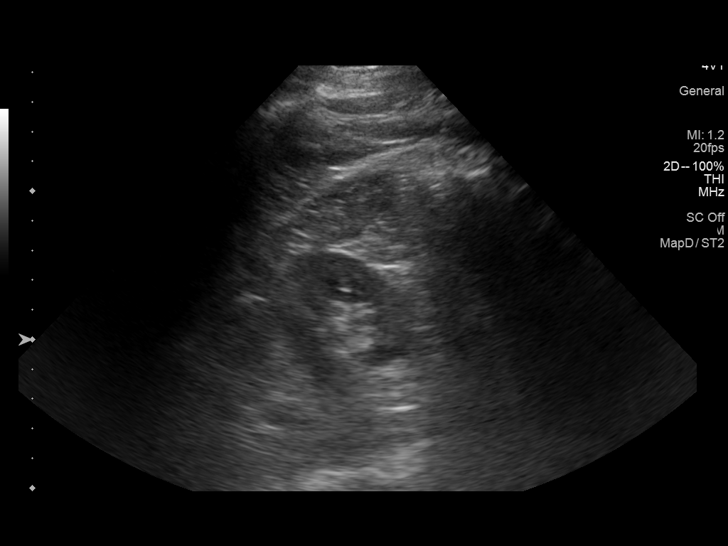
[im 20/54]
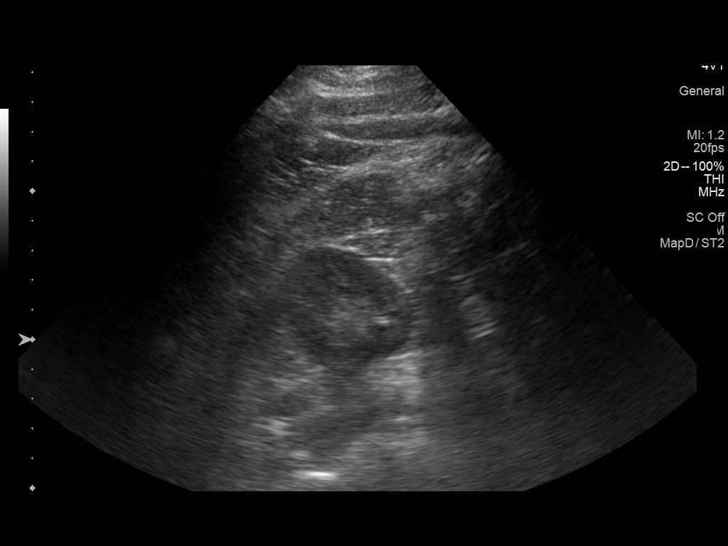
[im 25/54]
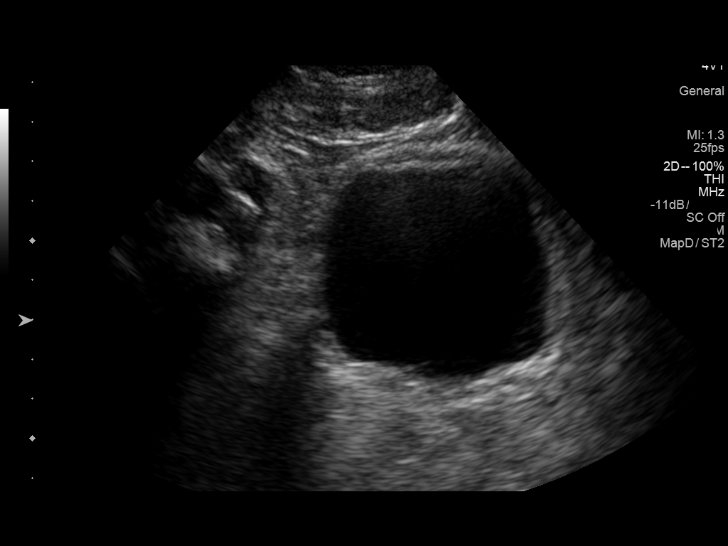
[im 29/54]
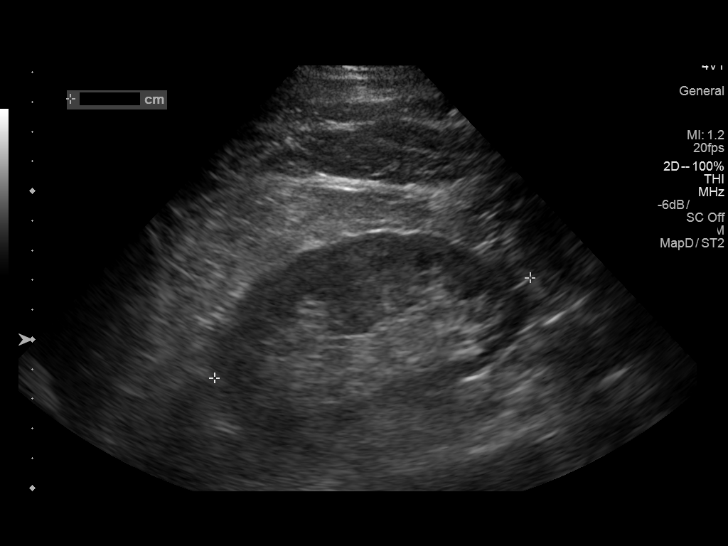
[im 34/54]
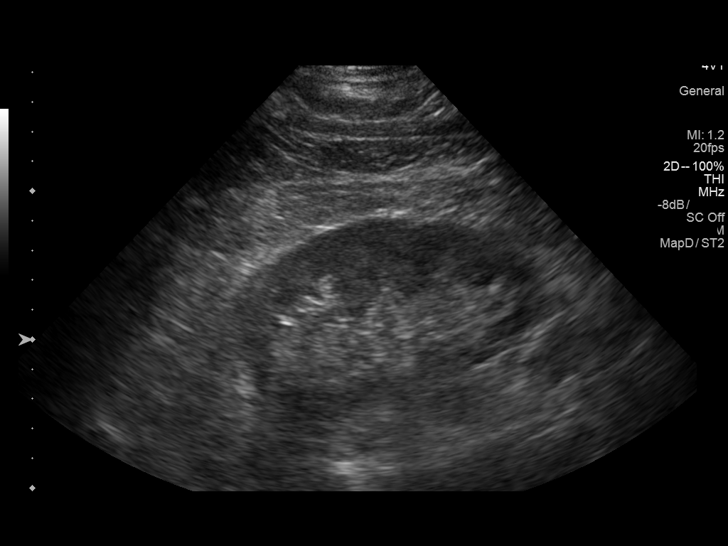
[im 36/54]
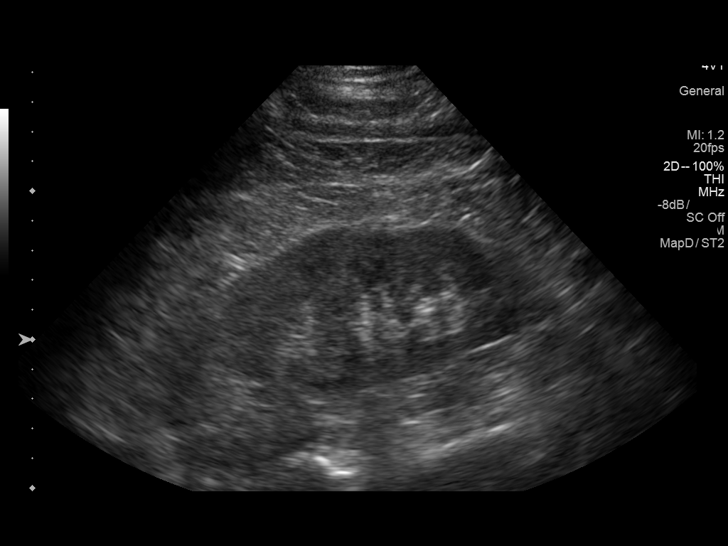
[im 40/54]
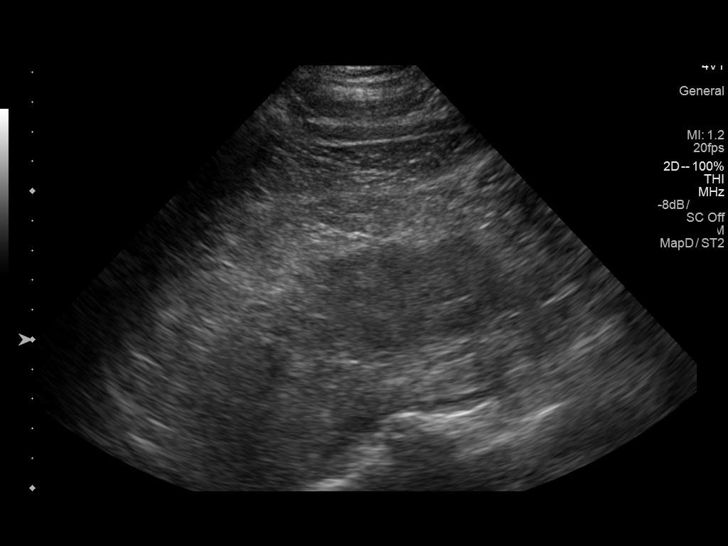
[im 45/54]
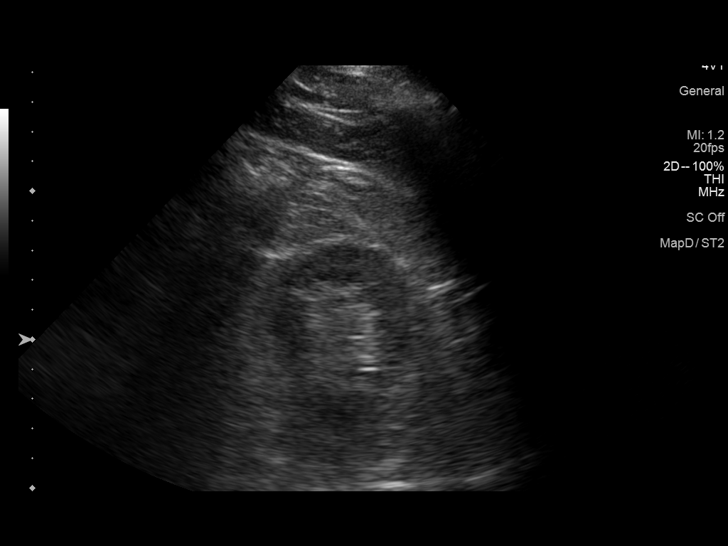
[im 49/54]
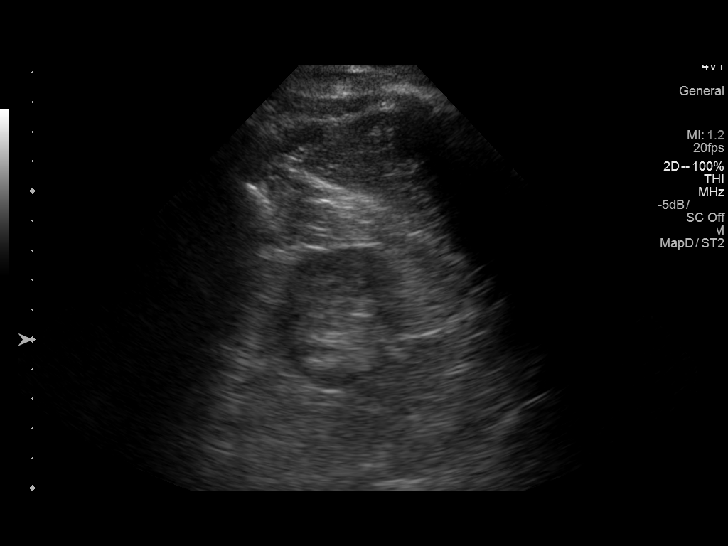
[im 54/54]
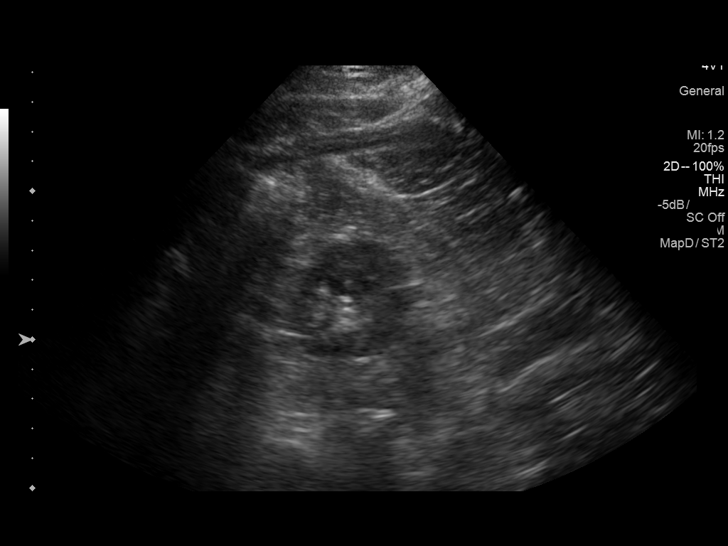

[14 of 25 positions shown; findings below may reference images not displayed]

FINDINGS: Right Kidney:

Length: 11.1 cm. Echogenicity within normal limits. No mass or
hydronephrosis visualized. Mild prominence of the renal sinus fat.

Left Kidney:

Length: 11.2 cm. Echogenicity within normal limits. No mass or
hydronephrosis visualized. Mild prominence of the renal sinus fat.

Bladder:

Appears normal for degree of bladder distention.
IMPRESSION: Negative renal ultrasound.  No evidence of hydronephrosis.
